# Patient Record
Sex: Female | Born: 1955 | Race: White | Hispanic: No | Marital: Married | Smoking: Current every day smoker
Health system: Southern US, Community
[De-identification: ages and names within clinical notes are randomized; demographics above are authoritative.]

## PROBLEM LIST (undated history)

## (undated) DIAGNOSIS — K589 Irritable bowel syndrome without diarrhea: Secondary | ICD-10-CM

## (undated) DIAGNOSIS — F32A Depression, unspecified: Secondary | ICD-10-CM

## (undated) DIAGNOSIS — J449 Chronic obstructive pulmonary disease, unspecified: Secondary | ICD-10-CM

## (undated) DIAGNOSIS — F329 Major depressive disorder, single episode, unspecified: Secondary | ICD-10-CM

## (undated) HISTORY — PX: BACK SURGERY: SHX140

---

## 2018-10-14 ENCOUNTER — Other Ambulatory Visit: Payer: Self-pay

## 2018-10-14 ENCOUNTER — Observation Stay
Admission: EM | Admit: 2018-10-14 | Discharge: 2018-10-15 | Disposition: A | Discharge status: Home / Self Care | Payer: BLUE CROSS/BLUE SHIELD | Attending: Internal Medicine | Admitting: Internal Medicine

## 2018-10-14 ENCOUNTER — Emergency Department: Payer: BLUE CROSS/BLUE SHIELD

## 2018-10-14 DIAGNOSIS — J9601 Acute respiratory failure with hypoxia: Principal | ICD-10-CM | POA: Diagnosis present

## 2018-10-14 DIAGNOSIS — J449 Chronic obstructive pulmonary disease, unspecified: Secondary | ICD-10-CM

## 2018-10-14 DIAGNOSIS — J101 Influenza due to other identified influenza virus with other respiratory manifestations: Secondary | ICD-10-CM | POA: Diagnosis present

## 2018-10-14 DIAGNOSIS — R0902 Hypoxemia: Secondary | ICD-10-CM | POA: Diagnosis present

## 2018-10-14 DIAGNOSIS — K589 Irritable bowel syndrome without diarrhea: Secondary | ICD-10-CM | POA: Diagnosis not present

## 2018-10-14 DIAGNOSIS — G8929 Other chronic pain: Secondary | ICD-10-CM | POA: Insufficient documentation

## 2018-10-14 DIAGNOSIS — M549 Dorsalgia, unspecified: Secondary | ICD-10-CM | POA: Insufficient documentation

## 2018-10-14 DIAGNOSIS — F419 Anxiety disorder, unspecified: Secondary | ICD-10-CM | POA: Diagnosis not present

## 2018-10-14 DIAGNOSIS — E876 Hypokalemia: Secondary | ICD-10-CM | POA: Diagnosis not present

## 2018-10-14 DIAGNOSIS — F17210 Nicotine dependence, cigarettes, uncomplicated: Secondary | ICD-10-CM | POA: Diagnosis not present

## 2018-10-14 DIAGNOSIS — E785 Hyperlipidemia, unspecified: Secondary | ICD-10-CM | POA: Insufficient documentation

## 2018-10-14 DIAGNOSIS — K219 Gastro-esophageal reflux disease without esophagitis: Secondary | ICD-10-CM | POA: Diagnosis not present

## 2018-10-14 DIAGNOSIS — M48062 Spinal stenosis, lumbar region with neurogenic claudication: Secondary | ICD-10-CM | POA: Diagnosis not present

## 2018-10-14 DIAGNOSIS — F329 Major depressive disorder, single episode, unspecified: Secondary | ICD-10-CM | POA: Insufficient documentation

## 2018-10-14 DIAGNOSIS — J441 Chronic obstructive pulmonary disease with (acute) exacerbation: Secondary | ICD-10-CM | POA: Diagnosis not present

## 2018-10-14 HISTORY — DX: Irritable bowel syndrome without diarrhea: K58.9

## 2018-10-14 HISTORY — DX: Chronic obstructive pulmonary disease, unspecified: J44.9

## 2018-10-14 HISTORY — DX: Depression, unspecified: F32.A

## 2018-10-14 HISTORY — DX: Major depressive disorder, single episode, unspecified: F32.9

## 2018-10-14 HISTORY — DX: Irritable bowel syndrome, unspecified: K58.9

## 2018-10-14 LAB — COMPREHENSIVE METABOLIC PANEL
ALT: 22 U/L (ref 0–44)
AST: 31 U/L (ref 15–41)
Albumin: 4.2 g/dL (ref 3.5–5.0)
Alkaline Phosphatase: 70 U/L (ref 38–126)
Anion gap: 13 (ref 5–15)
BUN: 8 mg/dL (ref 6–20)
CO2: 25 mmol/L (ref 22–32)
Calcium: 9.1 mg/dL (ref 8.9–10.3)
Chloride: 94 mmol/L — ABNORMAL LOW (ref 98–111)
Creatinine, Ser: 0.64 mg/dL (ref 0.44–1.00)
GFR calc Af Amer: >60 mL/min (ref >60)
GFR calc non Af Amer: >60 mL/min (ref >60)
GLUCOSE: 129 mg/dL — AB (ref 70–99)
POTASSIUM: 3.3 mmol/L — AB (ref 3.5–5.1)
Sodium: 132 mmol/L — ABNORMAL LOW (ref 135–145)
TOTAL PROTEIN: 7.6 g/dL (ref 6.5–8.1)
Total Bilirubin: 0.7 mg/dL (ref 0.3–1.2)

## 2018-10-14 LAB — URINALYSIS, COMPLETE (UACMP) WITH MICROSCOPIC
Bacteria, UA: NONE SEEN
Bilirubin Urine: NEGATIVE
GLUCOSE, UA: NEGATIVE mg/dL
Ketones, ur: 5 mg/dL — AB
Leukocytes, UA: NEGATIVE
Nitrite: NEGATIVE
Specific Gravity, Urine: 1.01 (ref 1.005–1.030)
pH: 7 (ref 5.0–8.0)

## 2018-10-14 LAB — INFLUENZA PANEL BY PCR (TYPE A & B)
Influenza A By PCR: POSITIVE — AB
Influenza B By PCR: NEGATIVE

## 2018-10-14 LAB — CBC
HCT: 46.5 % — ABNORMAL HIGH (ref 36.0–46.0)
HEMOGLOBIN: 16.1 g/dL — AB (ref 12.0–15.0)
MCH: 32.7 pg (ref 26.0–34.0)
MCHC: 34.6 g/dL (ref 30.0–36.0)
MCV: 94.3 fL (ref 80.0–100.0)
Platelets: 288 10*3/uL (ref 150–400)
RBC: 4.93 MIL/uL (ref 3.87–5.11)
RDW: 12.4 % (ref 11.5–15.5)
WBC: 5 10*3/uL (ref 4.0–10.5)
nRBC: 0 % (ref 0.0–0.2)

## 2018-10-14 LAB — BLOOD GAS, VENOUS
ACID-BASE EXCESS: 6.1 mmol/L — AB (ref 0.0–2.0)
Bicarbonate: 31.8 mmol/L — ABNORMAL HIGH (ref 20.0–28.0)
O2 Saturation: 74.8 %
PCO2 VEN: 49 mmHg (ref 44.0–60.0)
Patient temperature: 37
pH, Ven: 7.42 (ref 7.250–7.430)
pO2, Ven: 39 mmHg (ref 32.0–45.0)

## 2018-10-14 LAB — LIPASE, BLOOD: Lipase: 30 U/L (ref 11–51)

## 2018-10-14 MED ORDER — ONDANSETRON HCL 4 MG/2ML IJ SOLN: 4.0000 mg | Freq: Four times a day (QID) | INTRAMUSCULAR | Status: DC | PRN

## 2018-10-14 MED ORDER — BENZONATATE 100 MG PO CAPS
200.0000 mg | ORAL_CAPSULE | Freq: Three times a day (TID) | ORAL | Status: DC | PRN
  Administered 2018-10-14: 13:00:00 200 mg via ORAL
  Filled 2018-10-14: qty 2

## 2018-10-14 MED ORDER — ONDANSETRON HCL 4 MG PO TABS
4.0000 mg | ORAL_TABLET | Freq: Four times a day (QID) | ORAL | Status: DC | PRN
  Administered 2018-10-15: 17:00:00 4 mg via ORAL
  Filled 2018-10-14: qty 1

## 2018-10-14 MED ORDER — VORTIOXETINE HBR 10 MG PO TABS: 10.0000 mg | ORAL_TABLET | Freq: Every day | ORAL | Status: DC

## 2018-10-14 MED ORDER — GABAPENTIN 100 MG PO CAPS
400.0000 mg | ORAL_CAPSULE | Freq: Two times a day (BID) | ORAL | Status: DC
  Administered 2018-10-14 – 2018-10-15 (×2): 400 mg via ORAL
  Filled 2018-10-14 (×2): qty 4

## 2018-10-14 MED ORDER — METHYLPREDNISOLONE SODIUM SUCC 40 MG IJ SOLR
40.0000 mg | Freq: Two times a day (BID) | INTRAMUSCULAR | Status: DC
  Administered 2018-10-14 – 2018-10-15 (×2): 40 mg via INTRAVENOUS
  Filled 2018-10-14 (×2): qty 1

## 2018-10-14 MED ORDER — SODIUM CHLORIDE 0.9 % IV SOLN
Freq: Once | INTRAVENOUS | Status: AC
  Administered 2018-10-14: 09:00:00 via INTRAVENOUS

## 2018-10-14 MED ORDER — BISACODYL 5 MG PO TBEC: 5.0000 mg | DELAYED_RELEASE_TABLET | Freq: Every day | ORAL | Status: DC | PRN

## 2018-10-14 MED ORDER — ACETAMINOPHEN 325 MG PO TABS: 650.0000 mg | ORAL_TABLET | Freq: Four times a day (QID) | ORAL | Status: DC | PRN

## 2018-10-14 MED ORDER — IPRATROPIUM-ALBUTEROL 0.5-2.5 (3) MG/3ML IN SOLN
3.0000 mL | Freq: Once | RESPIRATORY_TRACT | Status: AC
  Administered 2018-10-14: 3 mL via RESPIRATORY_TRACT
  Filled 2018-10-14: qty 3

## 2018-10-14 MED ORDER — TRAZODONE HCL 50 MG PO TABS: 25.0000 mg | ORAL_TABLET | Freq: Every evening | ORAL | Status: DC | PRN

## 2018-10-14 MED ORDER — NICOTINE 14 MG/24HR TD PT24
14.0000 mg | MEDICATED_PATCH | Freq: Every day | TRANSDERMAL | Status: DC
  Administered 2018-10-14 – 2018-10-15 (×2): 14 mg via TRANSDERMAL
  Filled 2018-10-14 (×2): qty 1

## 2018-10-14 MED ORDER — OSELTAMIVIR PHOSPHATE 75 MG PO CAPS
75.0000 mg | ORAL_CAPSULE | Freq: Two times a day (BID) | ORAL | Status: DC
  Administered 2018-10-14 – 2018-10-15 (×2): 75 mg via ORAL
  Filled 2018-10-14 (×3): qty 1

## 2018-10-14 MED ORDER — HYDROCODONE-ACETAMINOPHEN 5-325 MG PO TABS
1.0000 | ORAL_TABLET | ORAL | Status: DC | PRN
  Administered 2018-10-14: 1 via ORAL
  Administered 2018-10-15 (×2): 2 via ORAL
  Filled 2018-10-14: qty 1
  Filled 2018-10-14 (×2): qty 2

## 2018-10-14 MED ORDER — DOCUSATE SODIUM 100 MG PO CAPS
100.0000 mg | ORAL_CAPSULE | Freq: Two times a day (BID) | ORAL | Status: DC
  Administered 2018-10-14 (×2): 100 mg via ORAL
  Filled 2018-10-14 (×3): qty 1

## 2018-10-14 MED ORDER — ENOXAPARIN SODIUM 40 MG/0.4ML ~~LOC~~ SOLN
40.0000 mg | SUBCUTANEOUS | Status: DC
  Administered 2018-10-14 – 2018-10-15 (×2): 40 mg via SUBCUTANEOUS
  Filled 2018-10-14 (×2): qty 0.4

## 2018-10-14 MED ORDER — GABAPENTIN 600 MG PO TABS: 600.0000 mg | ORAL_TABLET | Freq: Four times a day (QID) | ORAL | Status: DC

## 2018-10-14 MED ORDER — METHYLPREDNISOLONE SODIUM SUCC 125 MG IJ SOLR
125.0000 mg | Freq: Once | INTRAMUSCULAR | Status: AC
  Administered 2018-10-14: 125 mg via INTRAVENOUS
  Filled 2018-10-14: qty 2

## 2018-10-14 MED ORDER — SODIUM CHLORIDE 0.9 % IV SOLN
INTRAVENOUS | Status: DC
  Administered 2018-10-14 – 2018-10-15 (×2): via INTRAVENOUS

## 2018-10-14 MED ORDER — VORTIOXETINE HBR 20 MG PO TABS: 20.0000 mg | ORAL_TABLET | Freq: Every day | ORAL | Status: DC

## 2018-10-14 MED ORDER — METOPROLOL SUCCINATE ER 25 MG PO TB24
25.0000 mg | ORAL_TABLET | Freq: Every day | ORAL | Status: DC
  Administered 2018-10-15: 09:00:00 25 mg via ORAL
  Filled 2018-10-14: qty 1

## 2018-10-14 MED ORDER — OSELTAMIVIR PHOSPHATE 75 MG PO CAPS
75.0000 mg | ORAL_CAPSULE | Freq: Once | ORAL | Status: AC
  Administered 2018-10-14: 75 mg via ORAL
  Filled 2018-10-14: qty 1

## 2018-10-14 MED ORDER — ONDANSETRON HCL 4 MG/2ML IJ SOLN
4.0000 mg | Freq: Once | INTRAMUSCULAR | Status: AC
  Administered 2018-10-14: 4 mg via INTRAVENOUS
  Filled 2018-10-14: qty 2

## 2018-10-14 MED ORDER — IPRATROPIUM-ALBUTEROL 0.5-2.5 (3) MG/3ML IN SOLN
3.0000 mL | Freq: Four times a day (QID) | RESPIRATORY_TRACT | Status: DC
  Administered 2018-10-14 – 2018-10-15 (×5): 3 mL via RESPIRATORY_TRACT
  Filled 2018-10-14 (×4): qty 3

## 2018-10-14 MED ORDER — ACETAMINOPHEN 650 MG RE SUPP: 650.0000 mg | Freq: Four times a day (QID) | RECTAL | Status: DC | PRN

## 2018-10-14 MED ORDER — CLONAZEPAM 0.5 MG PO TABS
1.0000 mg | ORAL_TABLET | Freq: Two times a day (BID) | ORAL | Status: DC
  Administered 2018-10-14 – 2018-10-15 (×3): 1 mg via ORAL
  Filled 2018-10-14 (×3): qty 2

## 2018-10-14 NOTE — ED Notes (Signed)
Pt refused to hold nebulizer in mouth stating "I can't" - spouse attempted to hold in mouth for pt but states he is unsure how much she actually received

## 2018-10-14 NOTE — ED Triage Notes (Addendum)
[place] comlaints. States groin pain. Nausea. Constipation but "going a little" states symptoms began last night. Also coughing. Denies fever or vomiting.   Hx COPD. Was 87% on RA. Placed on 2 L [place]. Does NOT wear oxygen normally.   Husband states hx of low sodium. Noted by this RN pain patch to R arm.

## 2018-10-14 NOTE — ED Provider Notes (Signed)
[facility] Emergency Department Provider Note       Time seen: ----------------------------------------- 8:35 AM on 10/14/2018 -----------------------------------------   I have reviewed the triage vital signs and the nursing notes.  HISTORY   Chief Complaint Abdominal Pain    HPI Kaitlin Rogers is a 62 y.o. female with a history of COPD, depression, IBS who presents to the ED for multiple complaints.  Patient is from out [place] state, was driving to see grandchildren.  She describes left-sided groin pain.  She is also had nausea and constipation, cough with shortness of breath.  She denies fever or vomiting.  She has a history of COPD, was noted to be hypoxic in the upper 80s on room air.  Husband states she has a history of low sodium and this has presented similarly.  Past Medical History:  Diagnosis Date  . COPD (chronic obstructive pulmonary disease) (HCC)   . Depression   . IBS (irritable bowel syndrome)     There are no active problems to display for this patient.   Past Surgical History:  Procedure Laterality Date  . BACK SURGERY      Allergies Augmentin [amoxicillin-pot clavulanate]  Social History Social History   Tobacco Use  . Smoking status: Current Every Day Smoker  Substance Use Topics  . Alcohol use: Not Currently  . Drug use: Not on file   Review of Systems Constitutional: Negative for fever. Cardiovascular: Negative for chest pain. Respiratory: Positive for shortness of breath and cough Gastrointestinal: Positive for abdominal pain, constipation, nausea Genitourinary: Negative for dysuria. Musculoskeletal: Negative for back pain. Skin: Negative for rash. Neurological: Negative for headaches, positive for weakness  All systems negative/normal/unremarkable except as stated in the HPI  ____________________________________________   PHYSICAL EXAM:  VITAL SIGNS: ED Triage Vitals [10/14/18 0812]  Enc Vitals Group   BP (!) 163/94     Pulse Rate 96     Resp 20     Temp 98.3 F (36.8 C)     Temp Source Oral     SpO2 90 %     Weight 180 lb (81.6 kg)     Height 5' (1.524 m)     Head Circumference      Peak Flow      Pain Score 8     Pain Loc      Pain Edu?      Excl. in GC?    Constitutional: Alert and oriented.  Mild distress Eyes: Conjunctivae are normal. Normal extraocular movements. ENT   Head: Normocephalic and atraumatic.   Nose: No congestion/rhinnorhea.   Mouth/Throat: Mucous membranes are moist.   Neck: No stridor. Cardiovascular: Normal rate, regular rhythm. No murmurs, rubs, or gallops. Respiratory: Diminished breath sounds with wheezing and rhonchi bilaterally Gastrointestinal: Soft and nontender. Normal bowel sounds Musculoskeletal: Nontender with normal range of motion in extremities. No lower extremity tenderness nor edema. Neurologic:  Normal speech and language. No gross focal neurologic deficits are appreciated.  Skin:  Skin is warm, dry and intact. No rash noted. Psychiatric: Flat affect ____________________________________________  ED COURSE:  As part of my medical decision making, I reviewed the following data within the electronic MEDICAL RECORD NUMBER History obtained from family if available, nursing notes, old chart and ekg, as well as notes from prior ED visits. Patient presented for multiple complaints including abdominal pain, respiratory symptoms and weakness, we will assess with labs and imaging as indicated at this time. Clinical Course as of Oct 14 942  Wed  Oct 14, 2018  0825 02 dropped to 88% off oxygen   [JW]  0925 Influenza A By PCR(!): POSITIVE [JW]    Clinical Course User Index [JW] [name],  E, MD   Procedures ____________________________________________   LABS (pertinent positives/negatives)  Labs Reviewed  COMPREHENSIVE METABOLIC PANEL - Abnormal; Notable for the following components:      Result Value   Sodium 132 (*)     Potassium 3.3 (*)    Chloride 94 (*)    Glucose, Bld 129 (*)    All other components within normal limits  CBC - Abnormal; Notable for the following components:   Hemoglobin 16.1 (*)    HCT 46.5 (*)    All other components within normal limits  URINALYSIS, COMPLETE (UACMP) WITH MICROSCOPIC - Abnormal; Notable for the following components:   Color, Urine YELLOW (*)    APPearance CLEAR (*)    Hgb urine dipstick SMALL (*)    Ketones, ur 5 (*)    Protein, ur >=300 (*)    All other components within normal limits  INFLUENZA PANEL BY PCR (TYPE A & B) - Abnormal; Notable for the following components:   Influenza A By PCR POSITIVE (*)    All other components within normal limits  BLOOD GAS, VENOUS - Abnormal; Notable for the following components:   Bicarbonate 31.8 (*)    Acid-Base Excess 6.1 (*)    All other components within normal limits  LIPASE, BLOOD    RADIOLOGY Images were viewed by me  Acute abdominal series IMPRESSION: Negative abdominal radiographs.  No acute cardiopulmonary disease.  ____________________________________________  DIFFERENTIAL DIAGNOSIS   Dehydration, electrolyte abnormality, pneumonia, COPD, IBS, constipation  FINAL ASSESSMENT AND PLAN  Hypoxia, influenza A, COPD exacerbation   Plan: The patient had presented for multiple complaints including nausea, constipation, cough and shortness of breath. Patient's labs are grossly unremarkable with exception of influenza A positive by PCR. Patient's imaging not reveal any acute process.  She remains hypoxic off oxygen.  We gave a DuoNeb as well as Solu-Medrol and started on Tamiflu.  I will discuss with the hospitalist for admission.   Ulice DashJohnathan E , MD   Note: This note was generated in part or whole with voice recognition software. Voice recognition is usually quite accurate but there are transcription errors that can and very often do occur. I apologize for any typographical errors that were not  detected and corrected.     [name],  E, MD 10/14/18 [id number]

## 2018-10-14 NOTE — ED Notes (Signed)
Pt trial off of O2 - desat to 88% without exertion - placed back on 2L O2 via n/c

## 2018-10-14 NOTE — H&P (Signed)
[facility] [place] [facility]   PATIENT NAME: Kaitlin Rogers    MR#:  [id number]  DATE OF BIRTH:  06-29-1956  DATE OF ADMISSION:  10/14/2018  PRIMARY CARE PHYSICIAN: System, Pcp Not In   REQUESTING/REFERRING PHYSICIAN: Dr. Daryel November  CHIEF COMPLAINT: Shortness of breath   Chief Complaint  Patient presents with  . Abdominal Pain    HISTORY OF PRESENT ILLNESS:  Kaitlin Rogers  is a 62 y.o. female with a known history of COPD, depression, chronic back pain coming from [place] to stay with grandkids, noted to have worsening shortness of breath since Monday.  They arrived yesterday.  Has been having shortness of breath, cough, noted to have hypoxia with O2 sats upper 80s on room air, blood work showed mild hypokalemia, type influenza.  She is now on 2 L of oxygen, sats are 93%.  Received steroids, bronchodilators, sats improved to 89% on room air but still requiring 2 L oxygen to keep all sats more than 93%.  Patient has been told me that she is on Butrans patch 20 mcg once a weeks since few months, patch was changed last Thursday.  Patient also on Klonopin 1 mg 2 times daily as needed, according to him patient took her  Trintellix today not Neurontin, Klonopin and she is requesting to get them now.  PAST MEDICAL HISTORY:   Past Medical History:  Diagnosis Date  . COPD (chronic obstructive pulmonary disease) (HCC)   . Depression   . IBS (irritable bowel syndrome)     PAST SURGICAL HISTOIRY:   Past Surgical History:  Procedure Laterality Date  . BACK SURGERY      SOCIAL HISTORY:   Social History   Tobacco Use  . Smoking status: Current Every Day Smoker  Substance Use Topics  . Alcohol use: Not Currently    FAMILY HISTORY:  History reviewed. No pertinent family history.  DRUG ALLERGIES:   Allergies  Allergen Reactions  . Augmentin [Amoxicillin-Pot Clavulanate]     REVIEW OF SYSTEMS:  CONSTITUTIONAL: No fever,  generalized  weakness  eYES: No blurred or double vision.  EARS, NOSE, AND THROAT: No tinnitus or ear pain.  RESPIRATORY: Cough, shortness of breath, wheezing.   CARDIOVASCULAR: No chest pain, orthopnea, edema.  GASTROINTESTINAL: No nausea, vomiting, diarrhea or abdominal pain.  GENITOURINARY: No dysuria, hematuria.  ENDOCRINE: No polyuria, nocturia,  HEMATOLOGY: No anemia, easy bruising or bleeding SKIN: No rash or lesion. MUSCULOSKELETAL: No joint pain or arthritis.   NEUROLOGIC: No tingling, numbness, weakness.  PSYCHIATRY: No anxiety or depression.   MEDICATIONS AT HOME:   Prior to Admission medications   Medication Sig Start Date End Date Taking? Authorizing Provider  buprenorphine (BUTRANS) 20 MCG/HR PTWK patch Place 20 mg onto the skin once a week. 09/23/18  Yes [provider]  clonazePAM (KLONOPIN) 1 MG tablet Take 1 mg by mouth 4 (four) times daily as needed for anxiety. 08/05/17  Yes [provider]  etodolac (LODINE) 300 MG capsule Take 300 mg by mouth 3 (three) times daily as needed for pain. 08/04/18  Yes [provider]  fexofenadine-pseudoephedrine (ALLEGRA-D 24) 180-240 MG 24 hr tablet Take 1 tablet by mouth daily. 03/05/18  Yes [provider]  gabapentin (NEURONTIN) 600 MG tablet Take 600 mg by mouth 4 (four) times daily. 08/10/18  Yes [provider]  HYDROcodone-acetaminophen (NORCO) 7.5-325 MG tablet Take 1 tablet by mouth every 8 (eight) hours as needed for severe pain. 05/11/18  Yes [provider]  lactulose (CHRONULAC) 10 GM/15ML solution Take 10 mLs by mouth See admin instructions. Take 10 ml by mouth every 3 to 4 hours as needed for bowel movement 06/01/18  Yes [provider]  metoprolol succinate (TOPROL-XL) 25 MG 24 hr tablet Take 25 mg by mouth daily. 06/06/18  Yes [provider]  pantoprazole (PROTONIX) 40 MG tablet Take 40 mg by mouth 2 (two) times daily. 06/01/18  Yes [provider]   prochlorperazine (COMPAZINE) 10 MG tablet Take 10 mg by mouth every 8 (eight) hours as needed for nausea. 02/21/17  Yes [provider]  vortioxetine HBr (TRINTELLIX) 20 MG TABS tablet Take 20 mg by mouth daily. 03/07/16  Yes [provider]  vortioxetine HBr (TRINTELLIX) 5 MG TABS tablet Take 10 mg by mouth daily. 01/31/17  Yes [provider]  zolpidem (AMBIEN) 10 MG tablet Take 10 mg by mouth at bedtime as needed for sleep. 03/01/17  Yes [provider]      VITAL SIGNS:  Blood pressure 139/83, pulse 89, temperature 98.3 F (36.8 C), temperature source Oral, resp. rate 18, height 5' (1.524 m), weight 81.6 kg, SpO2 93 %.  PHYSICAL EXAMINATION:  GENERAL:  62 y.o.-year-old patient lying in the bed with no acute distress.  EYES: Pupils equal, round, reactive to light and accommodation. No scleral icterus. Extraocular muscles intact.  HEENT: Head atraumatic, normocephalic. Oropharynx and nasopharynx clear.  NECK:  Supple, no jugular venous distention. No thyroid enlargement, no tenderness.  LUNGS: Normal breath sounds bilaterally, no wheezing, rales,rhonchi or crepitation. No use of accessory muscles of respiration.  CARDIOVASCULAR: S1, S2 normal. No murmurs, rubs, or gallops.  ABDOMEN: Soft, nontender, nondistended. Bowel sounds present. No organomegaly or mass.  EXTREMITIES: No pedal edema, cyanosis, or clubbing.  NEUROLOGIC: Cranial nerves II through XII are intact. Muscle strength 5/5 in all extremities. Sensation intact. Gait not checked.  PSYCHIATRIC: The patient is alert and oriented x 3.  SKIN: No obvious rash, lesion, or ulcer.   LABORATORY PANEL:   CBC Recent Labs  Lab 10/14/18 0829  WBC 5.0  HGB 16.1*  HCT 46.5*  PLT 288   ------------------------------------------------------------------------------------------------------------------  Chemistries  Recent Labs  Lab 10/14/18 0829  NA 132*  K 3.3*  CL 94*  CO2 25  GLUCOSE 129*   BUN 8  CREATININE 0.64  CALCIUM 9.1  AST 31  ALT 22  ALKPHOS 70  BILITOT 0.7   ------------------------------------------------------------------------------------------------------------------  Cardiac Enzymes No results for input(s): TROPONINI in the last 168 hours. ------------------------------------------------------------------------------------------------------------------  RADIOLOGY:  Dg Abdomen Acute W/chest  Result Date: 10/14/2018 CLINICAL DATA:  Constipation EXAM: DG ABDOMEN ACUTE W/ 1V CHEST COMPARISON:  None. FINDINGS: The lungs are clear without focal pneumonia, edema, pneumothorax or pleural effusion. Interstitial markings are diffusely coarsened with chronic features. Linear atelectasis or scarring noted infrahilar left lower lung. The cardiopericardial silhouette is within normal limits for size. The visualized bony structures of the thorax are intact. Upright film shows no evidence for intraperitoneal free air. There is no evidence for gaseous bowel dilation to suggest obstruction. No unexpected abdominopelvic calcification. Status post lower lumbar laminectomy. IMPRESSION: Negative abdominal radiographs.  No acute cardiopulmonary disease. Electronically Signed   By: Kennith CenterEric  Mansell M.D.   On: 10/14/2018 09:07    EKG:  No orders found for this or any previous visit.  IMPRESSION AND PLAN:   62 year old female patient coming from [place] with multiple medical problems of anxiety, Barrett's esophagus, chronic abdominal pain, COPD, depression, GERD, hyperlipidemia,  IBS, neurogenic claudication of lumbar spinal stenosis, postoperative nausea, vomiting comes in with shortness of breath, cough getting worse since 2 days now found to have hypoxia on room air sats of 89% and type influenza.  Assessment and plan  1.acute respiratory failure with hypoxia secondary to COPD exacerbation, type influenza: Admit to medical service, start oxygen, continue bronchodilators,  Tamiflu for 5 days, see how she does if patient is able to come off oxygen likely discharge home soon so she can go back to [place]. 2.  Drug abuse: Counseled to quit smoking, offered nicotine patch. 3.  Chronic back pain, history of spinal injections and followed by laminectomy, patient [facility], reviewed the medical records, she also follows up with pain management, reviewed their note, patient is on Klonopin, Neurontin, Trintellix, she is also on medical marijuana, Butrans patch. 4.  Chronic anxiety, chronic pain seeking behavior she is on Klonopin 1 mg p.o. 4 times daily as needed for anxiety as per medical records.  #5. hypokalemia: Replace the potassium.  Discussed with husband. All the records are reviewed and case discussed with ED provider. Management plans discussed with the patient, family and they are in agreement.  CODE STATUS: Full code TOTAL TIME TAKING CARE OF THIS PATIENT: 55 minutes.    Katha HammingSnehalatha Sunnie Odden M.D on 10/14/2018 at 11:53 AM  Between 7am to 6pm - Pager - [phone]  After 6pm go to [email or web address] - password [email or web address]  [facility]  Office  [phone]  CC: Primary care physician; System, Pcp Not In  Note: This dictation was prepared with Dragon dictation along with smaller phrase technology. Any transcriptional errors that result from this process are unintentional.

## 2018-10-14 NOTE — ED Notes (Signed)
Nurse unavailable to receive report.

## 2018-10-14 NOTE — ED Notes (Signed)
Trial of pt off of O2 and desat to 89% on RA without any exertion - placed back on 2L of O2 via n/c

## 2018-10-15 LAB — CBC
HCT: 43.2 % (ref 36.0–46.0)
Hemoglobin: 14.8 g/dL (ref 12.0–15.0)
MCH: 32.6 pg (ref 26.0–34.0)
MCHC: 34.3 g/dL (ref 30.0–36.0)
MCV: 95.2 fL (ref 80.0–100.0)
NRBC: 0 % (ref 0.0–0.2)
Platelets: 291 10*3/uL (ref 150–400)
RBC: 4.54 MIL/uL (ref 3.87–5.11)
RDW: 12.6 % (ref 11.5–15.5)
WBC: 4.5 10*3/uL (ref 4.0–10.5)

## 2018-10-15 LAB — BASIC METABOLIC PANEL
Anion gap: 9 (ref 5–15)
BUN: 13 mg/dL (ref 6–20)
CO2: 27 mmol/L (ref 22–32)
Calcium: 8.8 mg/dL — ABNORMAL LOW (ref 8.9–10.3)
Chloride: 101 mmol/L (ref 98–111)
Creatinine, Ser: 0.65 mg/dL (ref 0.44–1.00)
GFR calc non Af Amer: >60 mL/min (ref >60)
Glucose, Bld: 158 mg/dL — ABNORMAL HIGH (ref 70–99)
Potassium: 3.6 mmol/L (ref 3.5–5.1)
SODIUM: 137 mmol/L (ref 135–145)

## 2018-10-15 LAB — GLUCOSE, CAPILLARY: Glucose-Capillary: 130 mg/dL — ABNORMAL HIGH (ref 70–99)

## 2018-10-15 MED ORDER — OSELTAMIVIR PHOSPHATE 75 MG PO CAPS: 75.0000 mg | ORAL_CAPSULE | Freq: Two times a day (BID) | ORAL | 0 refills | Status: AC

## 2018-10-15 MED ORDER — MOMETASONE FURO-FORMOTEROL FUM 200-5 MCG/ACT IN AERO: 2.0000 | INHALATION_SPRAY | Freq: Two times a day (BID) | RESPIRATORY_TRACT | 0 refills | Status: AC

## 2018-10-15 MED ORDER — NICOTINE 14 MG/24HR TD PT24: 14.0000 mg | MEDICATED_PATCH | Freq: Every day | TRANSDERMAL | 0 refills | Status: AC

## 2018-10-15 MED ORDER — PREDNISONE 50 MG PO TABS: 50.0000 mg | ORAL_TABLET | Freq: Every day | ORAL | 0 refills | Status: AC

## 2018-10-15 NOTE — Care Management Note (Signed)
Case Management Note  Patient Details  Name: Kaitlin Rogers MRN: [id number] Date of Birth: 04/28/1956  Subjective/Objective:     Patient is admitted with acute respiratory failure with hypoxemia and tested positive for the flu.  Discharge orders are in for today.  Husband will come and pick patient up this evening at 6pm they will stay with family tonight and wake up in the morning to drive home.  Patient is from home with husband and they live in [place].  Patient is independent in ADL's sometimes walks with a cane.  She and her husband are visiting family for Christmas.  Patient will not be discharged on oxygen.  Plan is for patient to follow up with her PCP in [place] on January 2nd.              Robbie LisJeanna Zhyon Antenucci RN BSN  [phone]     Action/Plan: Discharge home with self care- follow up with PCP 10/22/18  Expected Discharge Date:  10/15/18               Expected Discharge Plan:  Home/Self Care  In-House Referral:     Discharge planning Services  CM Consult  Post Acute Care Choice:    Choice offered to:     DME Arranged:    DME Agency:     HH Arranged:    HH Agency:     Status of Service:  Completed, signed off  If discussed at [employer] Length of Stay Meetings, dates discussed:    Additional Comments:  Allayne ButcherJeanna M Rafi Kenneth, RN 10/15/2018, 11:32 AM

## 2018-10-15 NOTE — Care Management Note (Signed)
Case Management Note  Patient Details  Name: Kaitlin BlakeKaren A Rogers MRN: [id number] Date of Birth: 05/21/1956  Subjective/Objective:  RNCM spoke with patient about staying over another night- if the patient decided to not discharge until tomorrow but the discharge order is still in then the patient may be responsible for the cost of that night in the hospital, because there is no medical necessity.   Patient and patient's husband verbalize understanding and state that they will discharge and go to a hotel until tomorrow when they can drive back to [place].  Husband wanted to make sure that the patient is medically stable- RNCM informed patient and husband that the doctor deemed her medically stable when the discharge order and discharge summary were entered, the patient's condition has not changed.  Husband has to go and pack and he will return later this afternoon to pick up the patient. Robbie LisJeanna Elfego Giammarino RN BSN                 Action/Plan:   Expected Discharge Date:  10/15/18               Expected Discharge Plan:  Home/Self Care  In-House Referral:     Discharge planning Services  CM Consult  Post Acute Care Choice:    Choice offered to:     DME Arranged:    DME Agency:     HH Arranged:    HH Agency:     Status of Service:  Completed, signed off  If discussed at [employer] Length [employer] Stay Meetings, dates discussed:    Additional Comments:  Allayne ButcherJeanna M Syvilla Martin, RN 10/15/2018, 2:17 PM

## 2018-10-15 NOTE — Discharge Summary (Addendum)
[facility] - [place] [facility]   PATIENT NAME: Kaitlin Rogers    MR#:  [id number]  DATE OF BIRTH:  12-10-1955  DATE OF ADMISSION:  10/14/2018 ADMITTING PHYSICIAN: Katha Hamming, MD  DATE OF DISCHARGE: 10/15/2018   PRIMARY CARE PHYSICIAN: System, Pcp Not In    ADMISSION DIAGNOSIS:  Influenza A [J10.1] Hypoxia [R09.02] Chronic obstructive pulmonary disease, unspecified COPD type (HCC) [J44.9]  DISCHARGE DIAGNOSIS:  Active Problems:   Acute respiratory failure with hypoxemia (HCC)   SECONDARY DIAGNOSIS:   Past Medical History:  Diagnosis Date  . COPD (chronic obstructive pulmonary disease) (HCC)   . Depression   . IBS (irritable bowel syndrome)     HOSPITAL COURSE:   62 year old female with tobacco dependence and COPD who presents to the emergency room with shortness of breath.  1.  Acute hypoxic respiratory failure in the setting of influenza A and COPD mild exacerbation  2.  Influenza A: Patient will need to continue Tamiflu for total 5 days.  3.  COPD exacerbation due to influenza A: Patient will be discharged on steroids as well as inhaler.  4.  Chronic back pain  5.  Hypokalemia: This was repleted  6. Tobacco dependence: Patient is encouraged to quit smoking. Counseling was provided for 4 minutes.   DISCHARGE CONDITIONS AND DIET:   Stable for discharge on regular diet  CONSULTS OBTAINED:    DRUG ALLERGIES:   Allergies  Allergen Reactions  . Augmentin [Amoxicillin-Pot Clavulanate]     DISCHARGE MEDICATIONS:   Allergies as of 10/15/2018      Reactions   Augmentin [amoxicillin-pot Clavulanate]       Medication List    TAKE these medications   BUTRANS 20 MCG/HR Ptwk patch Generic drug:  buprenorphine Place 20 mg onto the skin once a week.   clonazePAM 1 MG tablet Commonly known as:  KLONOPIN Take 1 mg by mouth 4 (four) times daily as needed for anxiety.   etodolac 300 MG capsule Commonly known as:  LODINE Take  300 mg by mouth 3 (three) times daily as needed for pain.   fexofenadine-pseudoephedrine 180-240 MG 24 hr tablet Commonly known as:  ALLEGRA-D 24 Take 1 tablet by mouth daily.   gabapentin 600 MG tablet Commonly known as:  NEURONTIN Take 600 mg by mouth 4 (four) times daily.   HYDROcodone-acetaminophen 7.5-325 MG tablet Commonly known as:  NORCO Take 1 tablet by mouth every 8 (eight) hours as needed for severe pain.   lactulose 10 GM/15ML solution Commonly known as:  CHRONULAC Take 10 mLs by mouth See admin instructions. Take 10 ml by mouth every 3 to 4 hours as needed for bowel movement   metoprolol succinate 25 MG 24 hr tablet Commonly known as:  TOPROL-XL Take 25 mg by mouth daily.   mometasone-formoterol 200-5 MCG/ACT Aero Commonly known as:  DULERA Inhale 2 puffs into the lungs 2 (two) times daily.   nicotine 14 mg/24hr patch Commonly known as:  NICODERM CQ - dosed in mg/24 hours Place 1 patch (14 mg total) onto the skin daily.   oseltamivir 75 MG capsule Commonly known as:  TAMIFLU Take 1 capsule (75 mg total) by mouth 2 (two) times daily for 4 days.   pantoprazole 40 MG tablet Commonly known as:  PROTONIX Take 40 mg by mouth 2 (two) times daily.   predniSONE 50 MG tablet Commonly known as:  DELTASONE Take 1 tablet (50 mg total) by mouth daily with breakfast for 4 days.  prochlorperazine 10 MG tablet Commonly known as:  COMPAZINE Take 10 mg by mouth every 8 (eight) hours as needed for nausea.   TRINTELLIX 20 MG Tabs tablet Generic drug:  vortioxetine HBr Take 20 mg by mouth daily.   TRINTELLIX 5 MG Tabs tablet Generic drug:  vortioxetine HBr Take 10 mg by mouth daily.   zolpidem 10 MG tablet Commonly known as:  AMBIEN Take 10 mg by mouth at bedtime as needed for sleep.            Durable Medical Equipment  (From admission, onward)         Start     Ordered   10/15/18 0851  DME Oxygen  Once    Question Answer Comment  Mode or (Route) Nasal  cannula   Frequency Continuous (stationary and portable oxygen unit needed)   Oxygen conserving device Yes   Oxygen delivery system Gas      10/15/18 0851            Today   CHIEF COMPLAINT:  Patient would like to go home.  Patient is breathing better.  Has cough shortness of breath is improved.   VITAL SIGNS:  Blood pressure 111/72, pulse 79, temperature 98.2 F (36.8 C), temperature source Oral, resp. rate 18, height 5' (1.524 m), weight 71.3 kg, SpO2 97 %.   REVIEW OF SYSTEMS:  Review of Systems  Constitutional: Negative.  Negative for chills, fever and malaise/fatigue.  HENT: Negative.  Negative for ear discharge, ear pain, hearing loss, nosebleeds and sore throat.   Eyes: Negative.  Negative for blurred vision and pain.  Respiratory: Positive for cough. Negative for hemoptysis, shortness of breath and wheezing.   Cardiovascular: Negative.  Negative for chest pain, palpitations and leg swelling.  Gastrointestinal: Negative.  Negative for abdominal pain, blood in stool, diarrhea, nausea and vomiting.  Genitourinary: Negative.  Negative for dysuria.  Musculoskeletal: Negative.  Negative for back pain.  Skin: Negative.   Neurological: Negative for dizziness, tremors, speech change, focal weakness, seizures and headaches.  Endo/Heme/Allergies: Negative.  Does not bruise/bleed easily.  Psychiatric/Behavioral: Negative.  Negative for depression, hallucinations and suicidal ideas.     PHYSICAL EXAMINATION:  GENERAL:  62 y.o.-year-old patient lying in the bed with no acute distress.  NECK:  Supple, no jugular venous distention. No thyroid enlargement, no tenderness.  LUNGS: Normal breath sounds bilaterally, no wheezing, rales,rhonchi  No use of accessory muscles of respiration.  CARDIOVASCULAR: S1, S2 normal. No murmurs, rubs, or gallops.  ABDOMEN: Soft, non-tender, non-distended. Bowel sounds present. No organomegaly or mass.  EXTREMITIES: No pedal edema, cyanosis, or  clubbing.  PSYCHIATRIC: The patient is alert and oriented x 3.  SKIN: No obvious rash, lesion, or ulcer.   DATA REVIEW:   CBC Recent Labs  Lab 10/15/18 0516  WBC 4.5  HGB 14.8  HCT 43.2  PLT 291    Chemistries  Recent Labs  Lab 10/14/18 0829 10/15/18 0516  NA 132* 137  K 3.3* 3.6  CL 94* 101  CO2 25 27  GLUCOSE 129* 158*  BUN 8 13  CREATININE 0.64 0.65  CALCIUM 9.1 8.8*  AST 31  --   ALT 22  --   ALKPHOS 70  --   BILITOT 0.7  --     Cardiac Enzymes No results for input(s): TROPONINI in the last 168 hours.  Microbiology Results  @[email or web address] @  RADIOLOGY:  Dg Abdomen Acute W/chest  Result Date: 10/14/2018 CLINICAL DATA:  Constipation EXAM: DG ABDOMEN ACUTE  W/ 1V CHEST COMPARISON:  None. FINDINGS: The lungs are clear without focal pneumonia, edema, pneumothorax or pleural effusion. Interstitial markings are diffusely coarsened with chronic features. Linear atelectasis or scarring noted infrahilar left lower lung. The cardiopericardial silhouette is within normal limits for size. The visualized bony structures of the thorax are intact. Upright film shows no evidence for intraperitoneal free air. There is no evidence for gaseous bowel dilation to suggest obstruction. No unexpected abdominopelvic calcification. Status post lower lumbar laminectomy. IMPRESSION: Negative abdominal radiographs.  No acute cardiopulmonary disease. Electronically Signed   By: Kennith CenterEric  Mansell M.D.   On: 10/14/2018 09:07      Allergies as of 10/15/2018      Reactions   Augmentin [amoxicillin-pot Clavulanate]       Medication List    TAKE these medications   BUTRANS 20 MCG/HR Ptwk patch Generic drug:  buprenorphine Place 20 mg onto the skin once a week.   clonazePAM 1 MG tablet Commonly known as:  KLONOPIN Take 1 mg by mouth 4 (four) times daily as needed for anxiety.   etodolac 300 MG capsule Commonly known as:  LODINE Take 300 mg by mouth 3 (three) times daily as needed for  pain.   fexofenadine-pseudoephedrine 180-240 MG 24 hr tablet Commonly known as:  ALLEGRA-D 24 Take 1 tablet by mouth daily.   gabapentin 600 MG tablet Commonly known as:  NEURONTIN Take 600 mg by mouth 4 (four) times daily.   HYDROcodone-acetaminophen 7.5-325 MG tablet Commonly known as:  NORCO Take 1 tablet by mouth every 8 (eight) hours as needed for severe pain.   lactulose 10 GM/15ML solution Commonly known as:  CHRONULAC Take 10 mLs by mouth See admin instructions. Take 10 ml by mouth every 3 to 4 hours as needed for bowel movement   metoprolol succinate 25 MG 24 hr tablet Commonly known as:  TOPROL-XL Take 25 mg by mouth daily.   mometasone-formoterol 200-5 MCG/ACT Aero Commonly known as:  DULERA Inhale 2 puffs into the lungs 2 (two) times daily.   nicotine 14 mg/24hr patch Commonly known as:  NICODERM CQ - dosed in mg/24 hours Place 1 patch (14 mg total) onto the skin daily.   oseltamivir 75 MG capsule Commonly known as:  TAMIFLU Take 1 capsule (75 mg total) by mouth 2 (two) times daily for 4 days.   pantoprazole 40 MG tablet Commonly known as:  PROTONIX Take 40 mg by mouth 2 (two) times daily.   predniSONE 50 MG tablet Commonly known as:  DELTASONE Take 1 tablet (50 mg total) by mouth daily with breakfast for 4 days.   prochlorperazine 10 MG tablet Commonly known as:  COMPAZINE Take 10 mg by mouth every 8 (eight) hours as needed for nausea.   TRINTELLIX 20 MG Tabs tablet Generic drug:  vortioxetine HBr Take 20 mg by mouth daily.   TRINTELLIX 5 MG Tabs tablet Generic drug:  vortioxetine HBr Take 10 mg by mouth daily.   zolpidem 10 MG tablet Commonly known as:  AMBIEN Take 10 mg by mouth at bedtime as needed for sleep.            Durable Medical Equipment  (From admission, onward)         Start     Ordered   10/15/18 0851  DME Oxygen  Once    Question Answer Comment  Mode or (Route) Nasal cannula   Frequency Continuous (stationary and  portable oxygen unit needed)   Oxygen conserving device Yes  Oxygen delivery system Gas      10/15/18 0851            Management plans discussed with the patient and she is in agreement. Stable for discharge   Patient should follow up with pcp in [place]  CODE STATUS:     Code Status Orders  (From admission, onward)         Start     Ordered   10/14/18 1035  Full code  Continuous     10/14/18 1039        Code Status History    This patient has a current code status but no historical code status.      TOTAL TIME TAKING CARE OF THIS PATIENT: 38 minutes.    Note: This dictation was prepared with Dragon dictation along with smaller phrase technology. Any transcriptional errors that result from this process are unintentional.  Sayre Witherington M.D on 10/15/2018 at 10:49 AM  Between 7am to 6pm - Pager - [phone] After 6pm go to [email or web address] - password [email or web address]  [facility]  Office  [phone]  CC: Primary care physician; System, Pcp Not In

## 2018-10-16 LAB — HIV ANTIBODY (ROUTINE TESTING W REFLEX): HIV Screen 4th Generation wRfx: NONREACTIVE

## 2020-05-17 IMAGING — CR DG ABDOMEN ACUTE W/ 1V CHEST
4 series · 4 of 4 positions shown · non-contrast
Comparison: None.

CLINICAL DATA: Constipation

EXAM:
DG ABDOMEN ACUTE W/ 1V CHEST

[chest pa]
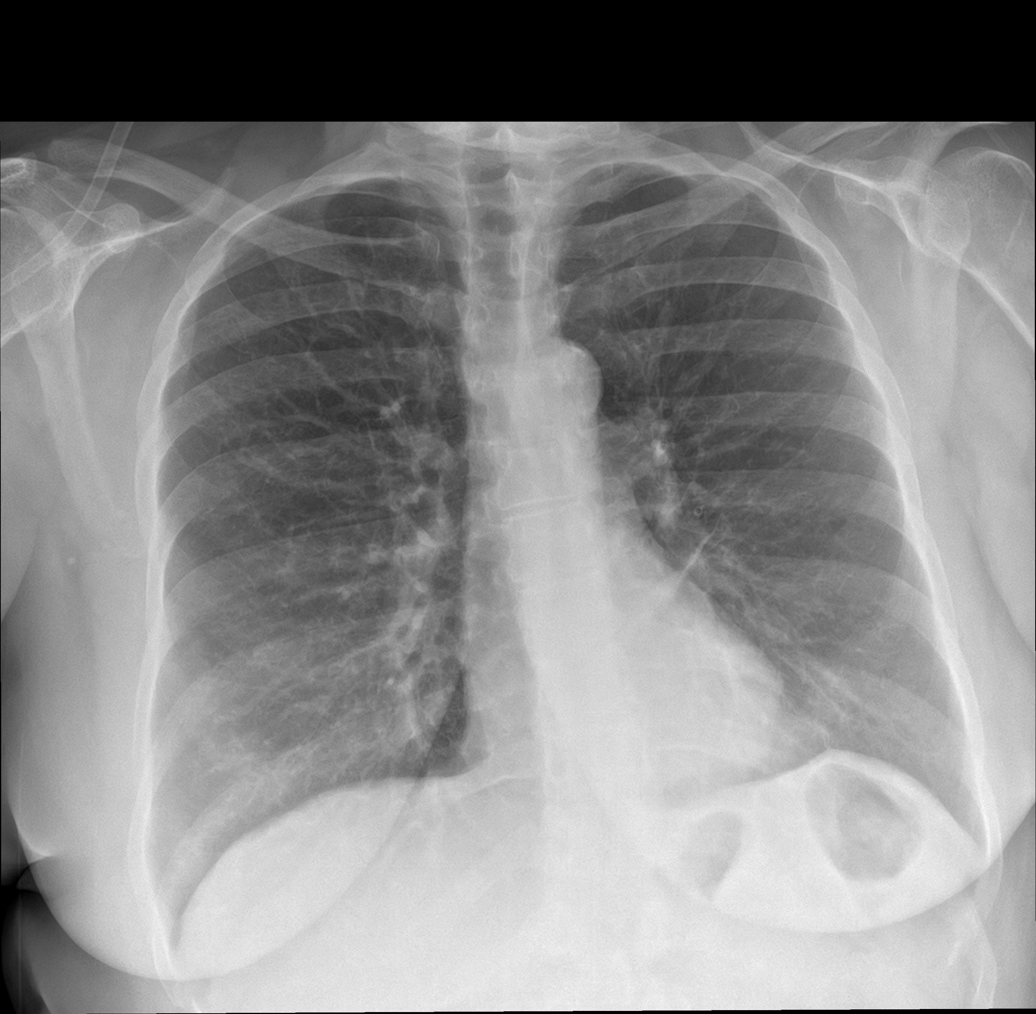

[abdomen erect]
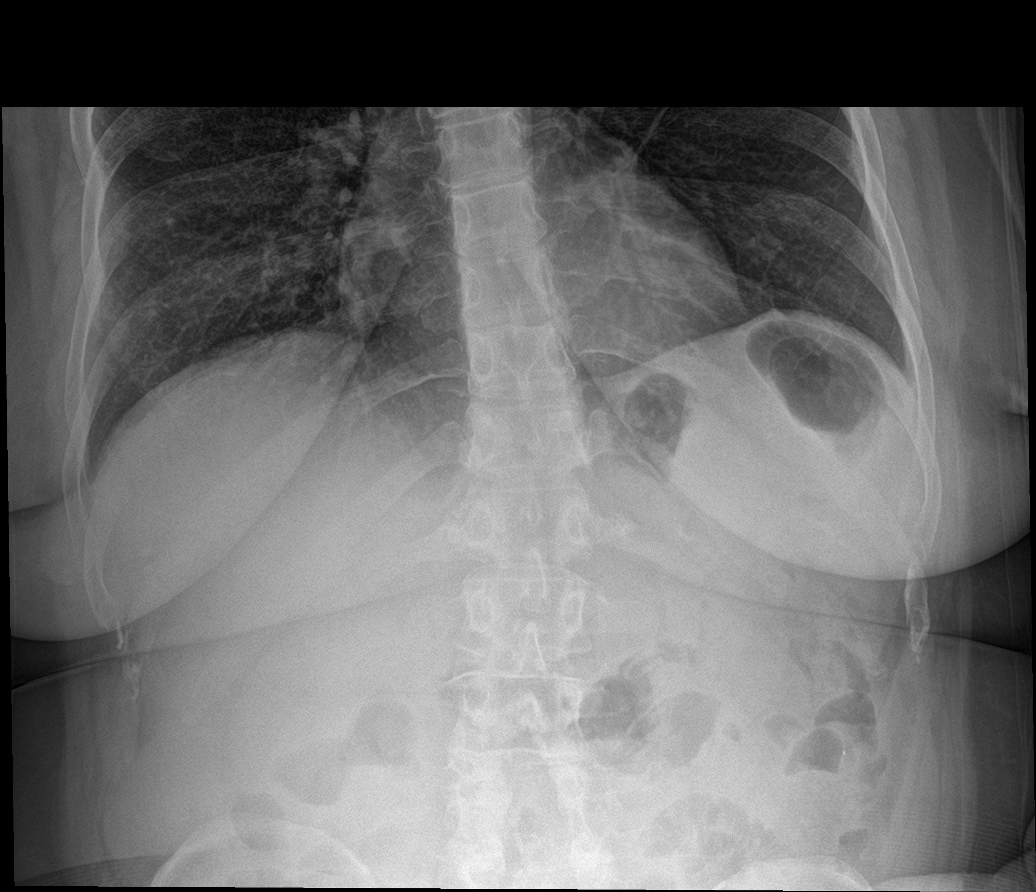

[abdomen supine (1 of 2)]
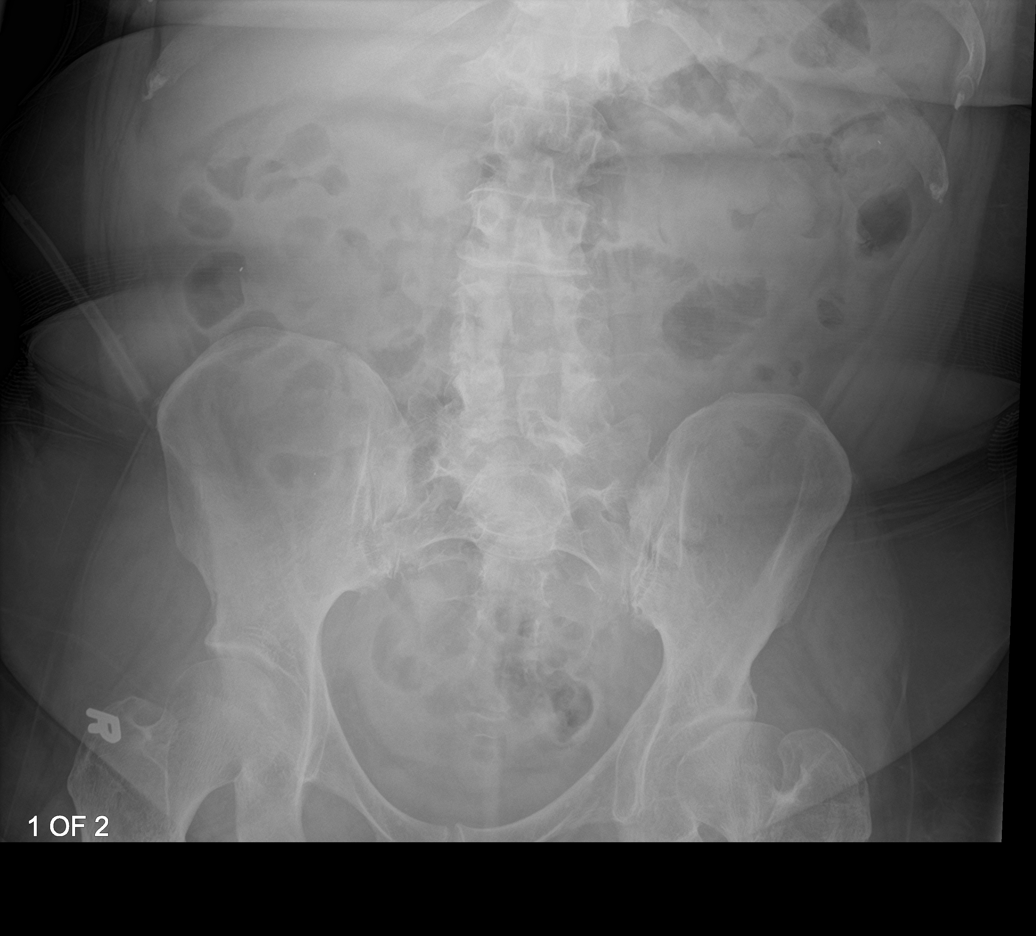

[abdomen supine (2 of 2)]
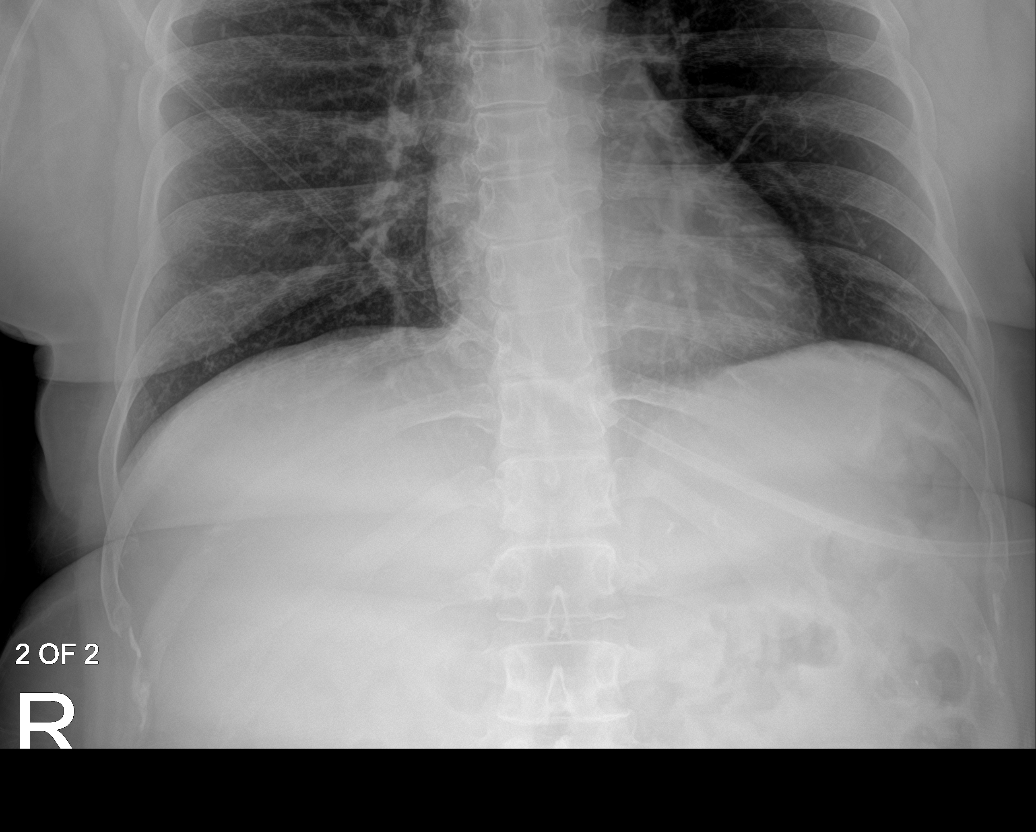

[4 of 4 positions shown; findings below may reference images not displayed]

FINDINGS: The lungs are clear without focal pneumonia, edema, pneumothorax or
pleural effusion. Interstitial markings are diffusely coarsened with
chronic features. Linear atelectasis or scarring noted infrahilar
left lower lung. The cardiopericardial silhouette is within normal
limits for size. The visualized bony structures of the thorax are
intact.

Upright film shows no evidence for intraperitoneal free air. There
is no evidence for gaseous bowel dilation to suggest obstruction. No
unexpected abdominopelvic calcification. Status post lower lumbar
laminectomy.
IMPRESSION: Negative abdominal radiographs.  No acute cardiopulmonary disease.
